# Patient Record
Sex: Male | Born: 1965 | Race: Black or African American | Hispanic: No | Marital: Single
Health system: Southern US, Community
[De-identification: ages and names within clinical notes are randomized; demographics above are authoritative.]

---

## 2004-09-30 ENCOUNTER — Emergency Department (HOSPITAL_COMMUNITY): Admission: EM | Admit: 2004-09-30 | Discharge: 2004-09-30 | Payer: Self-pay | Admitting: Emergency Medicine

## 2015-12-08 ENCOUNTER — Other Ambulatory Visit: Payer: Self-pay | Admitting: Internal Medicine

## 2015-12-08 DIAGNOSIS — R1310 Dysphagia, unspecified: Secondary | ICD-10-CM

## 2015-12-18 ENCOUNTER — Ambulatory Visit (HOSPITAL_COMMUNITY)
Admission: RE | Admit: 2015-12-18 | Discharge: 2015-12-18 | Disposition: A | Discharge status: Home / Self Care | Payer: Self-pay | Source: Ambulatory Visit | Attending: Internal Medicine | Admitting: Internal Medicine

## 2015-12-18 DIAGNOSIS — F209 Schizophrenia, unspecified: Secondary | ICD-10-CM | POA: Insufficient documentation

## 2015-12-18 DIAGNOSIS — R1310 Dysphagia, unspecified: Secondary | ICD-10-CM | POA: Insufficient documentation

## 2015-12-18 DIAGNOSIS — J45909 Unspecified asthma, uncomplicated: Secondary | ICD-10-CM | POA: Insufficient documentation

## 2015-12-18 DIAGNOSIS — Z86718 Personal history of other venous thrombosis and embolism: Secondary | ICD-10-CM | POA: Insufficient documentation

## 2015-12-18 DIAGNOSIS — I10 Essential (primary) hypertension: Secondary | ICD-10-CM | POA: Insufficient documentation

## 2015-12-18 DIAGNOSIS — R1312 Dysphagia, oropharyngeal phase: Secondary | ICD-10-CM | POA: Insufficient documentation

## 2015-12-18 NOTE — Progress Notes (Signed)
MBSS complete. Full report located under chart review in imaging section.  Aiven Kampe MA, CCC-SLP [phone]   

## 2016-12-27 IMAGING — RF DG SWALLOWING FUNCTION
1 series · 17 of 24 positions shown · non-contrast
Comparison: None.

CLINICAL DATA: [HOSPITAL] patient. Evaluate for swallow
dysfunction.

EXAM:
MODIFIED BARIUM SWALLOW
TECHNIQUE: Different consistencies of barium were administered orally to the
patient by the Speech Pathologist. Imaging of the pharynx was
performed in the lateral projection.
FLUOROSCOPY TIME:  If the device does not provide the exposure
index:
Fluoroscopy Time:  2 minutes and 23 seconds
Number of Acquired Images:  None

[Series 1: run · 22 acquisitions, 17 frames shown]
[im 1/22]
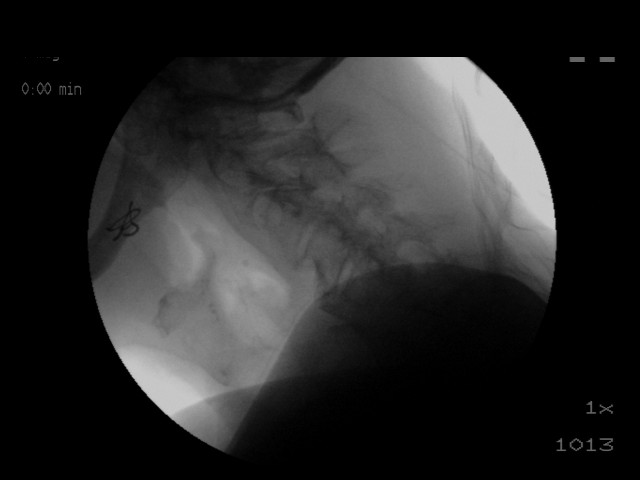
[im 2/22]
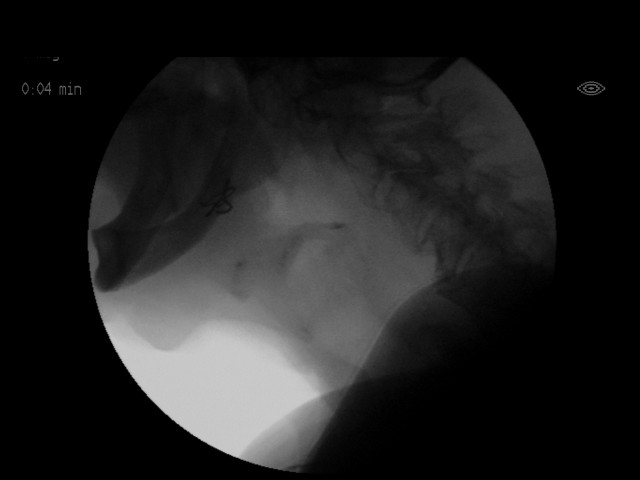
[im 3/22]
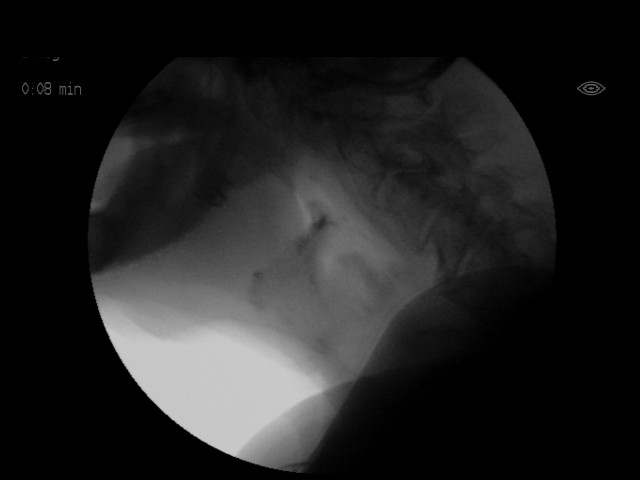
[im 4/22]
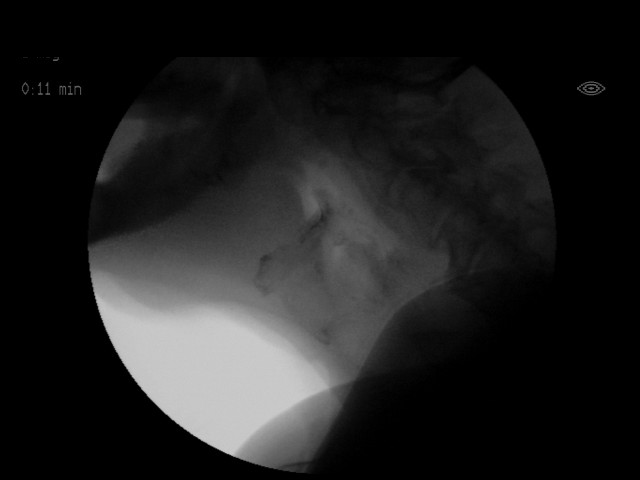
[im 6/22]
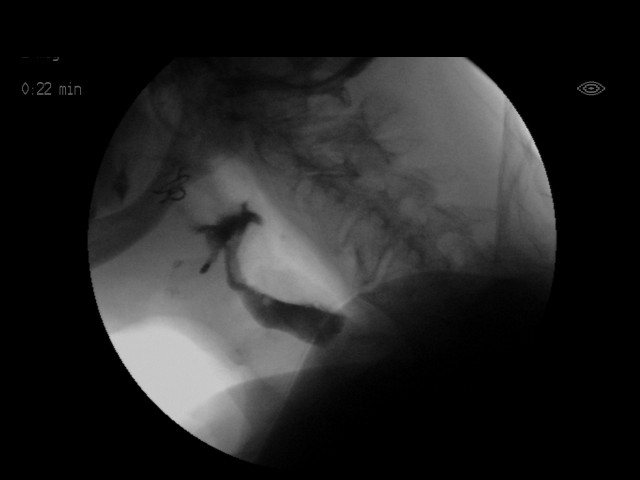
[im 7/22]
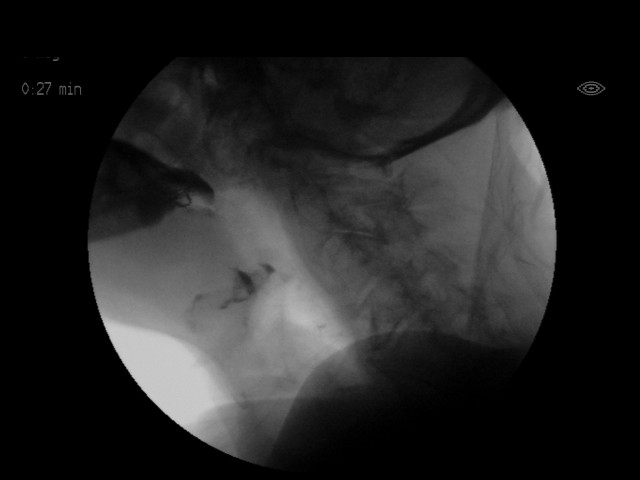
[im 9/22]
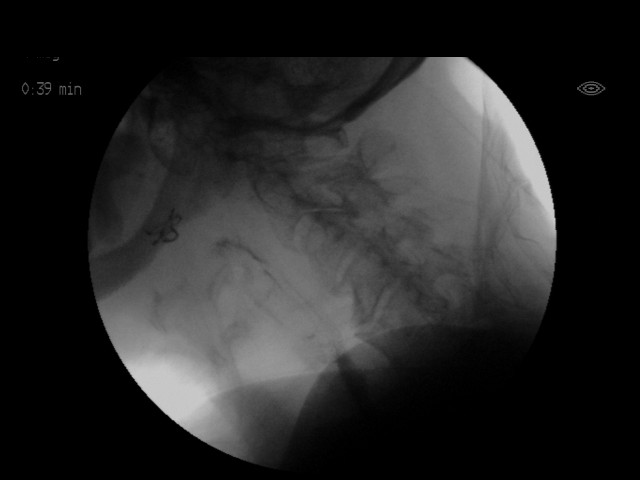
[im 10/22]
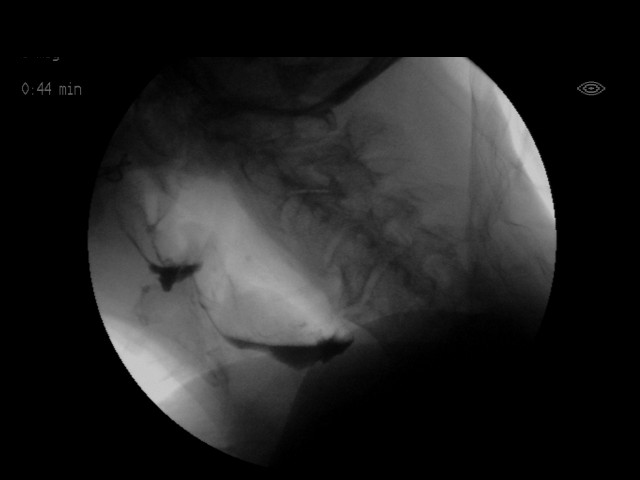
[im 12/22]
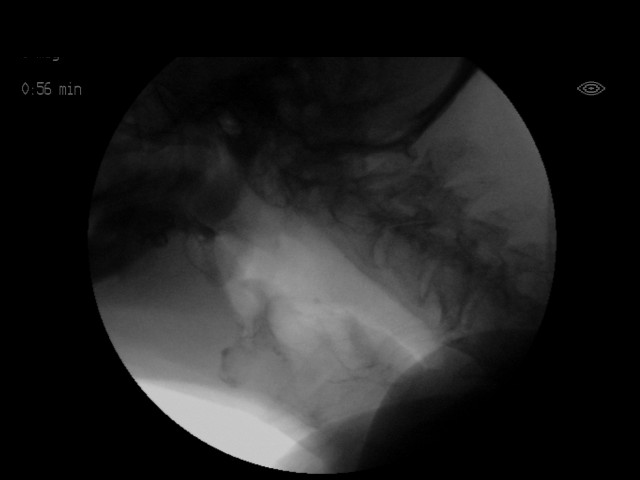
[im 13/22]
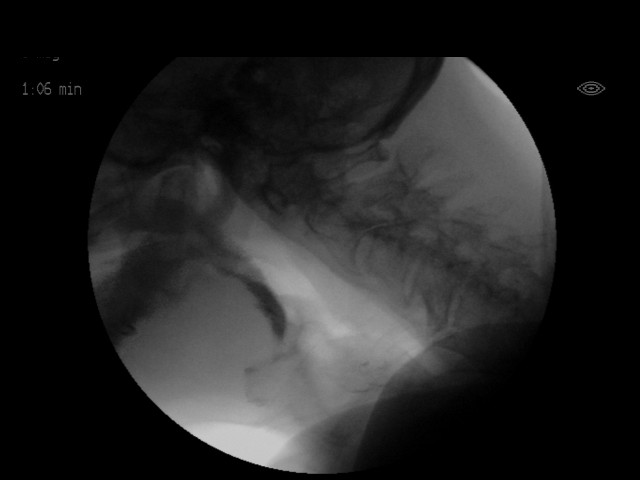
[im 14/22]
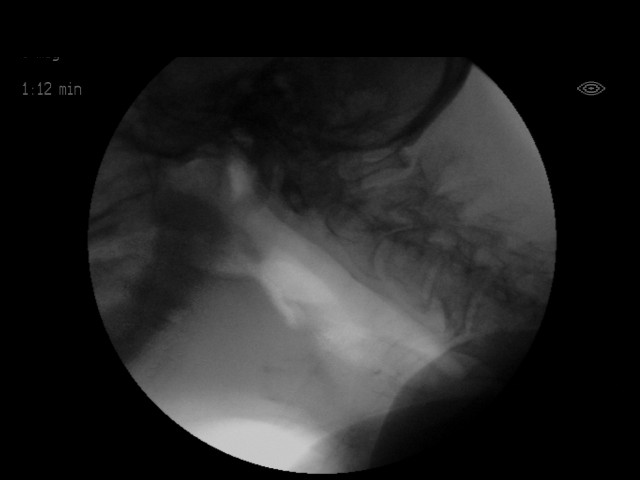
[im 16/22]
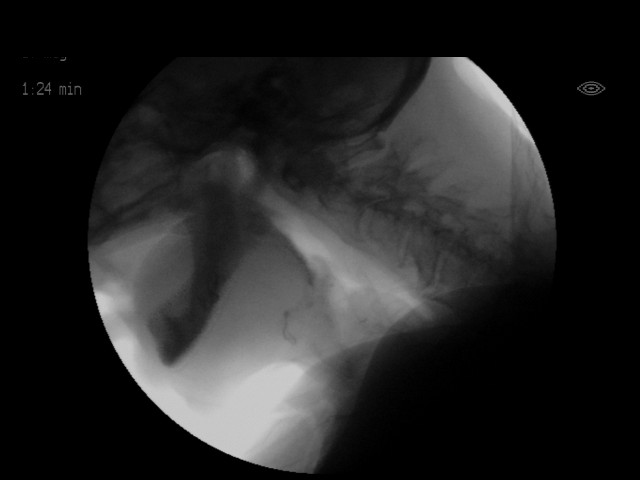
[im 17/22]
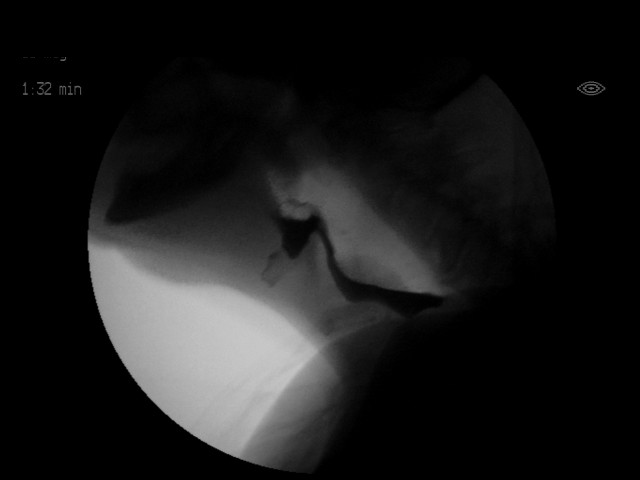
[im 19/22]
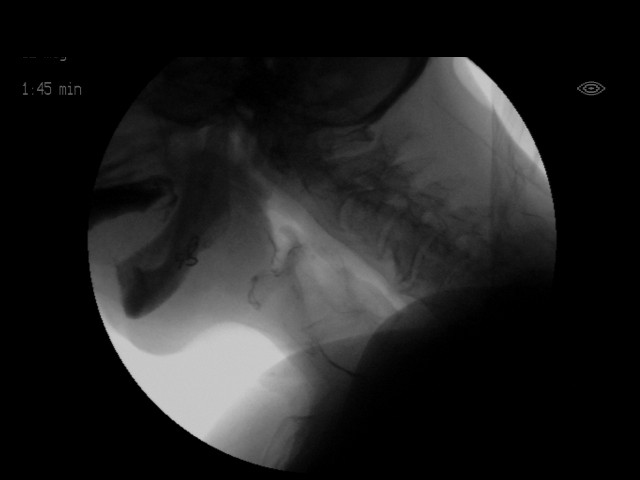
[im 20/22]
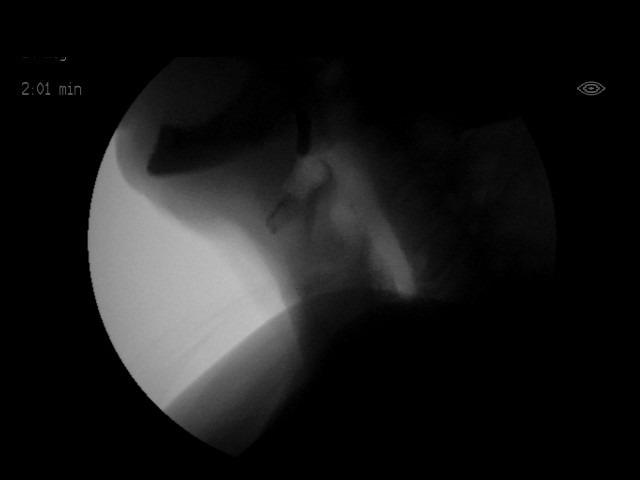
[im 21/22]
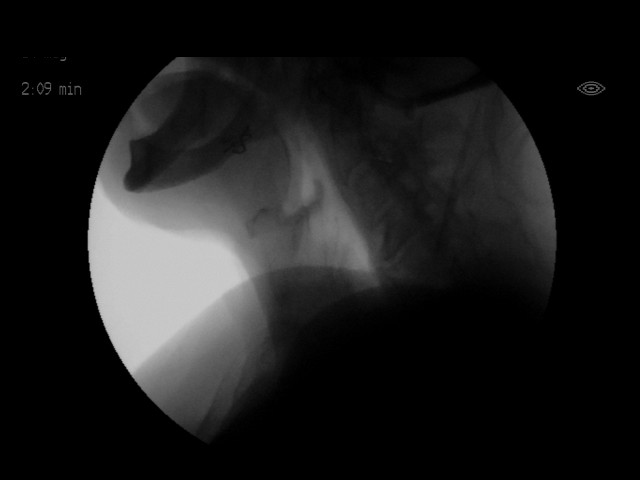
[im 22/22]
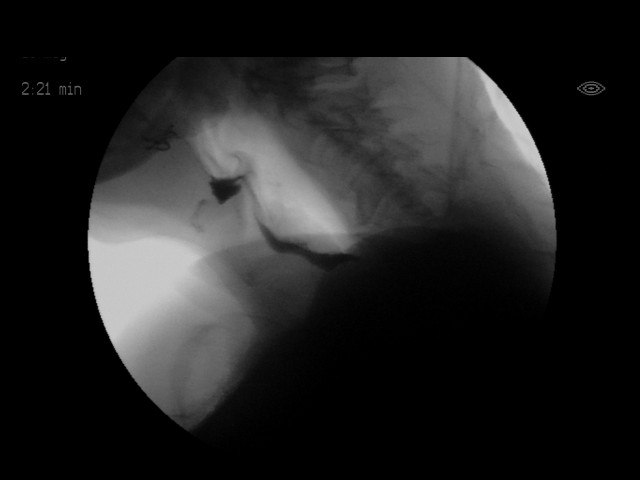

[17 of 24 positions shown; findings below may reference images not displayed]

FINDINGS: Thin liquid- premature spill to piriform sinuses. Delayed swallow
trigger. Minimal Aspiration just prior and during swallows.

Nectar thick liquid- delayed swallow trigger. Piriform sinus
premature spill. Minimal aspiration with swallows from a cup and
spoon.

Honey- delayed swallow trigger. Spill to piriform sinuses on some
swallows. No aspiration.

Vincenzi?Jaylon delayed swallow trigger with spill to the vallecula.

Vincenzi?Bladimir with cracker- vallecular spill with delayed swallow trigger.
IMPRESSION: Swallow dysfunction, as detailed above.

Please refer to the Speech Pathologists report for complete details
and recommendations.
